# Patient Record
Sex: Male | Born: 1955 | Race: White | Hispanic: No | Marital: Married | Smoking: Former smoker
Health system: Southern US, Community
[De-identification: ages and names within clinical notes are randomized; demographics above are authoritative.]

## PROBLEM LIST (undated history)

## (undated) DIAGNOSIS — N200 Calculus of kidney: Secondary | ICD-10-CM

## (undated) HISTORY — PX: JOINT REPLACEMENT: SHX530

## (undated) HISTORY — PX: NASAL SEPTUM SURGERY: SHX37

---

## 2011-08-24 DIAGNOSIS — J309 Allergic rhinitis, unspecified: Secondary | ICD-10-CM | POA: Insufficient documentation

## 2011-08-24 DIAGNOSIS — G4700 Insomnia, unspecified: Secondary | ICD-10-CM | POA: Insufficient documentation

## 2011-08-24 DIAGNOSIS — N400 Enlarged prostate without lower urinary tract symptoms: Secondary | ICD-10-CM | POA: Insufficient documentation

## 2011-08-24 DIAGNOSIS — F329 Major depressive disorder, single episode, unspecified: Secondary | ICD-10-CM | POA: Insufficient documentation

## 2011-08-24 DIAGNOSIS — G4733 Obstructive sleep apnea (adult) (pediatric): Secondary | ICD-10-CM | POA: Insufficient documentation

## 2011-08-24 DIAGNOSIS — N529 Male erectile dysfunction, unspecified: Secondary | ICD-10-CM | POA: Insufficient documentation

## 2011-08-24 DIAGNOSIS — M1990 Unspecified osteoarthritis, unspecified site: Secondary | ICD-10-CM | POA: Insufficient documentation

## 2012-06-11 DIAGNOSIS — M5441 Lumbago with sciatica, right side: Secondary | ICD-10-CM | POA: Insufficient documentation

## 2014-08-03 DIAGNOSIS — Z9884 Bariatric surgery status: Secondary | ICD-10-CM | POA: Insufficient documentation

## 2016-08-02 DIAGNOSIS — Z96651 Presence of right artificial knee joint: Secondary | ICD-10-CM | POA: Insufficient documentation

## 2018-01-21 DIAGNOSIS — Z87891 Personal history of nicotine dependence: Secondary | ICD-10-CM | POA: Diagnosis not present

## 2018-01-21 DIAGNOSIS — Z9660 Presence of unspecified orthopedic joint implant: Secondary | ICD-10-CM | POA: Diagnosis not present

## 2018-01-21 DIAGNOSIS — N200 Calculus of kidney: Secondary | ICD-10-CM | POA: Diagnosis not present

## 2018-01-21 DIAGNOSIS — R109 Unspecified abdominal pain: Secondary | ICD-10-CM | POA: Diagnosis present

## 2018-01-22 ENCOUNTER — Other Ambulatory Visit: Payer: Self-pay

## 2018-01-22 ENCOUNTER — Emergency Department: Payer: 59

## 2018-01-22 ENCOUNTER — Encounter: Payer: Self-pay | Admitting: *Deleted

## 2018-01-22 ENCOUNTER — Emergency Department
Admission: EM | Admit: 2018-01-22 | Discharge: 2018-01-22 | Disposition: A | Discharge status: Home / Self Care | Payer: 59 | Attending: Emergency Medicine | Admitting: Emergency Medicine

## 2018-01-22 DIAGNOSIS — N200 Calculus of kidney: Secondary | ICD-10-CM

## 2018-01-22 HISTORY — DX: Calculus of kidney: N20.0

## 2018-01-22 LAB — CBC
HCT: 46.4 % (ref 40.0–52.0)
HEMOGLOBIN: 15.9 g/dL (ref 13.0–18.0)
MCH: 31.3 pg (ref 26.0–34.0)
MCHC: 34.4 g/dL (ref 32.0–36.0)
MCV: 91.2 fL (ref 80.0–100.0)
PLATELETS: 226 10*3/uL (ref 150–440)
RBC: 5.08 MIL/uL (ref 4.40–5.90)
RDW: 13.4 % (ref 11.5–14.5)
WBC: 11.5 10*3/uL — ABNORMAL HIGH (ref 3.8–10.6)

## 2018-01-22 LAB — BASIC METABOLIC PANEL
Anion gap: 9 (ref 5–15)
BUN: 14 mg/dL (ref 6–20)
CALCIUM: 8.8 mg/dL — AB (ref 8.9–10.3)
CHLORIDE: 105 mmol/L (ref 101–111)
CO2: 27 mmol/L (ref 22–32)
CREATININE: 1.26 mg/dL — AB (ref 0.61–1.24)
GFR, EST NON AFRICAN AMERICAN: 59 mL/min — AB (ref >60)
Glucose, Bld: 120 mg/dL — ABNORMAL HIGH (ref 65–99)
Potassium: 3.8 mmol/L (ref 3.5–5.1)
SODIUM: 141 mmol/L (ref 135–145)

## 2018-01-22 LAB — URINALYSIS, COMPLETE (UACMP) WITH MICROSCOPIC
BILIRUBIN URINE: NEGATIVE
Bacteria, UA: NONE SEEN
Glucose, UA: NEGATIVE mg/dL
Hgb urine dipstick: NEGATIVE
KETONES UR: NEGATIVE mg/dL
Leukocytes, UA: NEGATIVE
Nitrite: NEGATIVE
PROTEIN: NEGATIVE mg/dL
Specific Gravity, Urine: 1.017 (ref 1.005–1.030)
Squamous Epithelial / LPF: NONE SEEN
pH: 5 (ref 5.0–8.0)

## 2018-01-22 MED ORDER — ONDANSETRON 4 MG PO TBDP: 4.0000 mg | ORAL_TABLET | Freq: Three times a day (TID) | ORAL | 0 refills | Status: DC | PRN

## 2018-01-22 MED ORDER — OXYCODONE-ACETAMINOPHEN 5-325 MG PO TABS
1.0000 | ORAL_TABLET | Freq: Once | ORAL | Status: AC
  Administered 2018-01-22: 1 via ORAL

## 2018-01-22 MED ORDER — ONDANSETRON 4 MG PO TBDP
4.0000 mg | ORAL_TABLET | Freq: Once | ORAL | Status: AC
  Administered 2018-01-22: 4 mg via ORAL

## 2018-01-22 MED ORDER — ONDANSETRON 8 MG PO TBDP
ORAL_TABLET | ORAL | Status: AC
  Administered 2018-01-22: 4 mg via ORAL
  Filled 2018-01-22: qty 1

## 2018-01-22 MED ORDER — OXYCODONE-ACETAMINOPHEN 5-325 MG PO TABS: 2.0000 | ORAL_TABLET | ORAL | 0 refills | Status: DC | PRN

## 2018-01-22 MED ORDER — KETOROLAC TROMETHAMINE 30 MG/ML IJ SOLN
30.0000 mg | Freq: Once | INTRAMUSCULAR | Status: AC
  Administered 2018-01-22: 30 mg via INTRAVENOUS
  Filled 2018-01-22: qty 1

## 2018-01-22 MED ORDER — SODIUM CHLORIDE 0.9 % IV BOLUS (SEPSIS)
1000.0000 mL | Freq: Once | INTRAVENOUS | Status: AC
  Administered 2018-01-22: 1000 mL via INTRAVENOUS

## 2018-01-22 MED ORDER — OXYCODONE-ACETAMINOPHEN 5-325 MG PO TABS
ORAL_TABLET | ORAL | Status: AC
  Administered 2018-01-22: 1 via ORAL
  Filled 2018-01-22: qty 1

## 2018-01-22 NOTE — ED Notes (Signed)
Pt states onset of left sided flank pain with nausea last pm. Pt with history of renal calculi. Pt states pain felt similar to renal calculi in past. Pt states pain is currently improved.

## 2018-01-22 NOTE — ED Triage Notes (Signed)
Pt c/o L flank pain starting at 203 last night. Pt c/o nausea and scant vomiting. Pt denies blood in urine and dysuria. Pt denies fever. Pt has hx of kidney stones in the past. Pt has taken no meds for his pain today.

## 2018-01-22 NOTE — Discharge Instructions (Signed)
Please follow up with urology if you still have pain in the next 2-3 days

## 2018-01-22 NOTE — ED Notes (Signed)
Report to alicia, rn.  

## 2018-01-22 NOTE — ED Provider Notes (Signed)
[facility] Emergency Department Provider Note   ____________________________________________   First MD Initiated Contact with Patient 01/22/18 [phone]     (approximate)  I have reviewed the triage vital signs and the nursing notes.   HISTORY  Chief Complaint Flank Pain    HPI Brian Wright is a 62 y.o. male who comes into the hospital today because he is concerned that he may have a kidney stone.  The patient reports that he had some pain in his left side that came on quick around 8 PM.  He felt as though he was having a baby.  He denies any blood in his urine but he did have some nausea and vomiting.  He also denies pain with urination.  The patient has a history of kidney stones.  He is always passed them and is never had surgery for it.  His pain is currently a 2 out of 10 in intensity.  The patient is here today for evaluation.  Past Medical History:  Diagnosis Date  . Kidney stones     There are no active problems to display for this patient.   Past Surgical History:  Procedure Laterality Date  . JOINT REPLACEMENT    . NASAL SEPTUM SURGERY      Prior to Admission medications   Medication Sig Start Date End Date Taking? Authorizing Provider  ondansetron (ZOFRAN ODT) 4 MG disintegrating tablet Take 1 tablet (4 mg total) by mouth every 8 (eight) hours as needed for nausea or vomiting. 01/22/18   Rebecka Apley, MD  oxyCODONE-acetaminophen (PERCOCET/ROXICET) 5-325 MG tablet Take 2 tablets by mouth every 4 (four) hours as needed for severe pain. 01/22/18   Rebecka Apley, MD    Allergies Patient has no known allergies.  History reviewed. No pertinent family history.  Social History Social History   Tobacco Use  . Smoking status: Former Games developer  . Smokeless tobacco: Former Neurosurgeon    Types: Chew  Substance Use Topics  . Alcohol use: Yes    Frequency: Never    Comment: rarely  . Drug use: No    Review of Systems  Constitutional:  No fever/chills Eyes: No visual changes. ENT: No sore throat. Cardiovascular: Denies chest pain. Respiratory: Denies shortness of breath. Gastrointestinal: Nausea and vomiting with no abdominal pain.   No diarrhea.  No constipation. Genitourinary: Negative for dysuria. Musculoskeletal: Left flank pain Skin: Negative for rash. Neurological: Negative for headaches, focal weakness or numbness.   ____________________________________________   PHYSICAL EXAM:  VITAL SIGNS: ED Triage Vitals  Enc Vitals Group     BP 01/22/18 0007 (!) 158/81     Pulse Rate 01/22/18 0007 66     Resp 01/22/18 0007 18     Temp 01/22/18 0007 97.8 F (36.6 C)     Temp Source 01/22/18 0007 Oral     SpO2 01/22/18 0007 97 %     Weight 01/22/18 0007 268 lb (121.6 kg)     Height 01/22/18 0007 5\' 10"  (1.778 m)     Head Circumference --      Peak Flow --      Pain Score 01/22/18 0014 10     Pain Loc --      Pain Edu? --      Excl. in GC? --     Constitutional: Alert and oriented. Well appearing and in no acute distress. Eyes: Conjunctivae are normal. PERRL. EOMI. Head: Atraumatic. Nose: No congestion/rhinnorhea. Mouth/Throat: Mucous membranes are moist.  Oropharynx  non-erythematous. Cardiovascular: Normal rate, regular rhythm. Grossly normal heart sounds.  Good peripheral circulation. Respiratory: Normal respiratory effort.  No retractions. Lungs CTAB. Gastrointestinal: Soft and nontender. No distention.  Positive bowel sounds mild left-sided flank pain Musculoskeletal: No lower extremity tenderness nor edema.   Neurologic:  Normal speech and language.  Skin:  Skin is warm, dry and intact.  Psychiatric: Mood and affect are normal.   ____________________________________________   LABS (all labs ordered are listed, but only abnormal results are displayed)  Labs Reviewed  URINALYSIS, COMPLETE (UACMP) WITH MICROSCOPIC - Abnormal; Notable for the following components:      Result Value   Color, Urine  YELLOW (*)    APPearance CLEAR (*)    All other components within normal limits  CBC - Abnormal; Notable for the following components:   WBC 11.5 (*)    All other components within normal limits  BASIC METABOLIC PANEL - Abnormal; Notable for the following components:   Glucose, Bld 120 (*)    Creatinine, Ser 1.26 (*)    Calcium 8.8 (*)    GFR calc non Af Amer 59 (*)    All other components within normal limits   ____________________________________________  EKG  none ____________________________________________  RADIOLOGY  ED MD interpretation:  CT renal stone study: 4mm left kidney stone with some hydronephrosis  Official radiology report(s): Ct Renal Stone Study  Result Date: 01/22/2018 CLINICAL DATA:  Left flank pain.  Nausea.  History of kidney stones. EXAM: CT ABDOMEN AND PELVIS WITHOUT CONTRAST TECHNIQUE: Multidetector CT imaging of the abdomen and pelvis was performed following the standard protocol without IV contrast. COMPARISON:  None. FINDINGS: Lower chest: The lung bases are clear. There are coronary artery calcifications. Hepatobiliary: No focal hepatic lesion allowing for lack contrast. Clips in the gallbladder fossa postcholecystectomy. No biliary dilatation. Pancreas: No ductal dilatation or inflammation. Spleen: Upper normal in size spanning 13 cm. Adrenals/Urinary Tract: No adrenal nodule. Obstructing 4 mm stone in the distal left ureter just proximal to the ureteropelvic junction with moderate proximal hydroureteronephrosis and prominent perinephric edema. 4.7 cm simple cyst in the lower left kidney. No additional nonobstructing renal calculi. No right hydronephrosis. Mild right perinephric edema is likely chronic. Urinary bladder is nondistended and not well evaluated. Stomach/Bowel: Post bariatric surgery with staple lines in the stomach. Enteric sutures in the central abdomen with mild prominence of the small bowel adjacent to the sutures. No evidence of obstruction  or bowel inflammation. Moderate colonic stool burden. Normal appendix tentatively identified. Vascular/Lymphatic: Trace distal aorta bi-iliac atherosclerosis. No aneurysm. 16 mm left inguinal lymph node. No enlarged abdominal lymph nodes. Reproductive: Prominent sized prostate gland spanning 5.9 cm with prostatic calcifications. Other: No ascites or free air.  Fat within both inguinal canals. Musculoskeletal: Multilevel degenerative change in the spine. Posterior disc osteophyte complex fat L1-L2 and L2-L3 causes spinal canal stenosis. IMPRESSION: 1. Obstructing 4 mm stone in the distal left ureter with moderate left hydronephrosis and prominent perinephric edema. 2. Prominent left inguinal node measuring 16 mm is nonspecific, may be reactive. This should be amenable to direct palpation and clinical follow-up. 3. Aortic atherosclerosis and coronary artery calcifications. 4. Additional chronic findings as described. Electronically Signed   By: Rubye OaksMelanie  Ehinger M.D.   On: 01/22/2018 01:03    ____________________________________________   PROCEDURES  Procedure(s) performed: None  Procedures  Critical Care performed: No  ____________________________________________   INITIAL IMPRESSION / ASSESSMENT AND PLAN / ED COURSE  As part of my medical decision making, I reviewed  the following data within the electronic MEDICAL RECORD NUMBER Notes from prior ED visits and [place] Controlled Substance Database   This is a 62 year old male who comes into the pain.  The patient has a history of kidney stones and is concerned that he may be having a kidney stone.  I did send some blood work on the patient as well as a urinalysis and a CT scan.  The patient's urinalysis is unremarkable and his blood work is unremarkable.  There is some mild elevation in his creatinine.   The patient CT scan showed a 4 mm obstructing stone in the distal left ureter with some moderate left hydronephrosis.  The patient's urinalysis does  not show any sign of infection.  His pain is improved.  I did give the patient some Toradol.  He will be discharged home and encouraged to follow-up with urology.  He should return with any other concerns.  The patient did receive a liter of normal saline.      ____________________________________________   FINAL CLINICAL IMPRESSION(S) / ED DIAGNOSES  Final diagnoses:  Kidney stone     ED Discharge Orders        Ordered    oxyCODONE-acetaminophen (PERCOCET/ROXICET) 5-325 MG tablet  Every 4 hours PRN     01/22/18 0524    ondansetron (ZOFRAN ODT) 4 MG disintegrating tablet  Every 8 hours PRN     01/22/18 0524       Note:  This document was prepared using Dragon voice recognition software and may include unintentional dictation errors.    Rebecka Apley, MD 01/22/18 [phone]

## 2019-08-15 ENCOUNTER — Other Ambulatory Visit: Payer: Self-pay

## 2019-08-18 ENCOUNTER — Ambulatory Visit: Payer: Self-pay | Admitting: Urology

## 2019-12-22 ENCOUNTER — Other Ambulatory Visit: Payer: Self-pay | Admitting: Physical Medicine and Rehabilitation

## 2019-12-22 DIAGNOSIS — M5412 Radiculopathy, cervical region: Secondary | ICD-10-CM

## 2019-12-29 ENCOUNTER — Other Ambulatory Visit: Payer: Self-pay

## 2019-12-29 ENCOUNTER — Ambulatory Visit
Admission: RE | Admit: 2019-12-29 | Discharge: 2019-12-29 | Disposition: A | Discharge status: Home / Self Care | Payer: Managed Care, Other (non HMO) | Source: Ambulatory Visit | Attending: Physical Medicine and Rehabilitation | Admitting: Physical Medicine and Rehabilitation

## 2019-12-29 DIAGNOSIS — M5412 Radiculopathy, cervical region: Secondary | ICD-10-CM

## 2020-03-18 ENCOUNTER — Other Ambulatory Visit: Payer: Self-pay | Admitting: Otolaryngology

## 2020-03-18 DIAGNOSIS — J324 Chronic pansinusitis: Secondary | ICD-10-CM

## 2020-03-22 ENCOUNTER — Ambulatory Visit
Admission: RE | Admit: 2020-03-22 | Discharge: 2020-03-22 | Disposition: A | Discharge status: Home / Self Care | Payer: 59 | Source: Ambulatory Visit | Attending: Otolaryngology | Admitting: Otolaryngology

## 2020-03-22 ENCOUNTER — Other Ambulatory Visit: Payer: Self-pay

## 2020-03-22 DIAGNOSIS — J324 Chronic pansinusitis: Secondary | ICD-10-CM | POA: Insufficient documentation

## 2020-09-25 IMAGING — CT CT MAXILLOFACIAL W/O CM
3 of 5 series · 11 of 47 positions shown, 13 images · non-contrast
Comparison: None.

CLINICAL DATA: Chronic pansinusitis

EXAM:
CT MAXILLOFACIAL WITHOUT CONTRAST
TECHNIQUE: Multidetector CT imaging of the maxillofacial structures was
performed. Multiplanar CT image reconstructions were also generated.

[Series 2: brain lab 1.00 · axial · 0.49mm/px · z∈[-583,-498]mm · 5 of 117 slices shown, 7 images]
[im 16/117  brain]
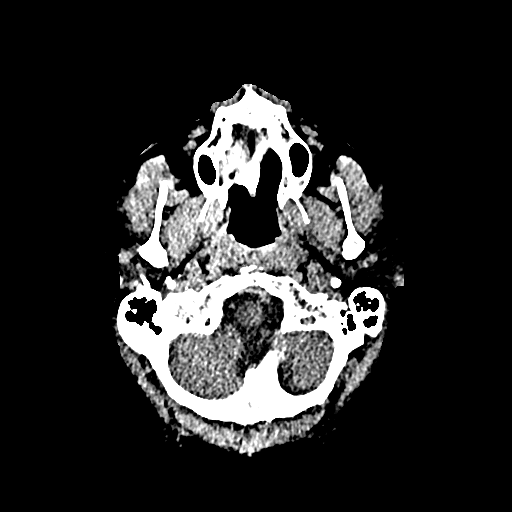
[im 16/117  bone]
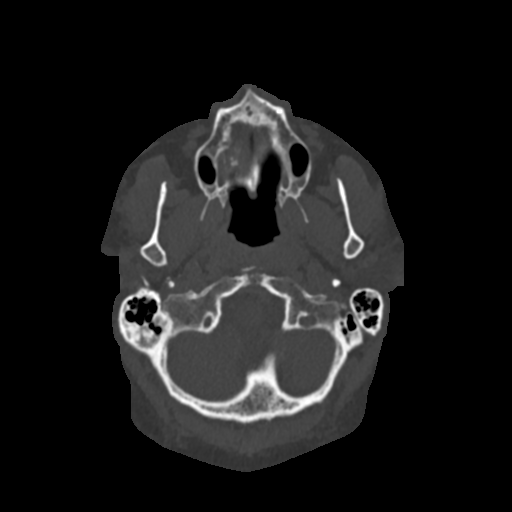
[im 39/117  bone]
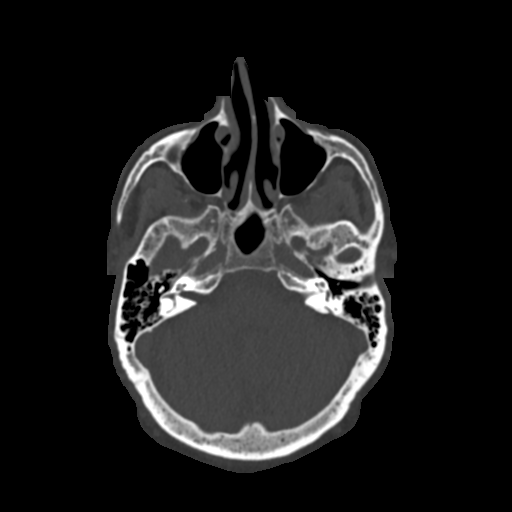
[im 62/117  bone]
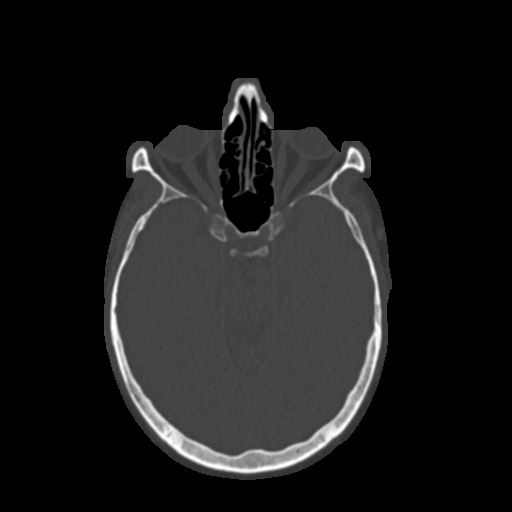
[im 78/117  bone]
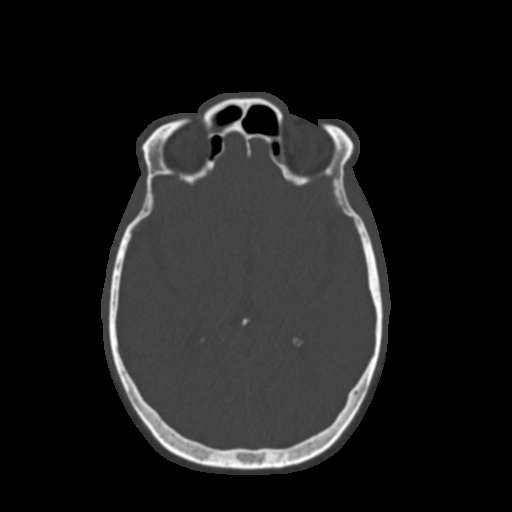
[im 101/117  brain]
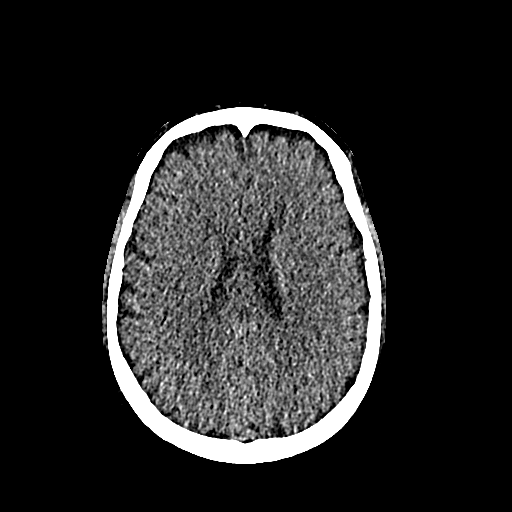
[im 101/117  bone]
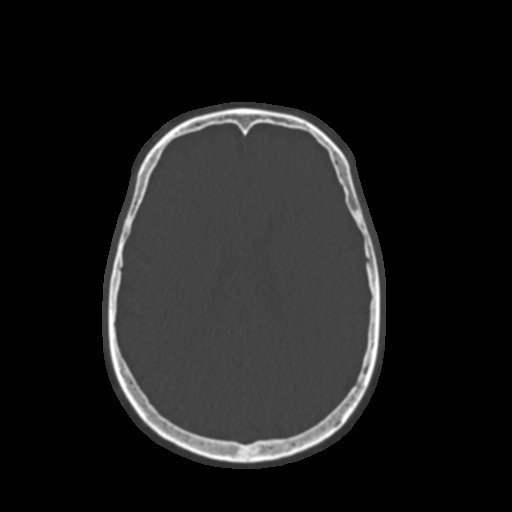

[Series 7: coronal soft brain lab 2.00 cor · coronal · 0.23mm/px · 3 of 118 slices shown]
[im 40/118  bone]
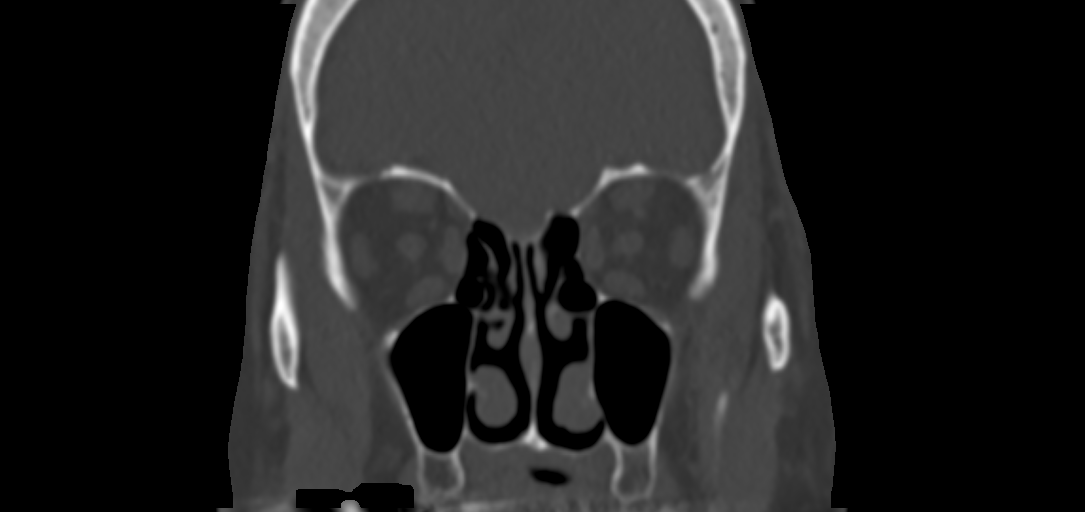
[im 53/118  bone]
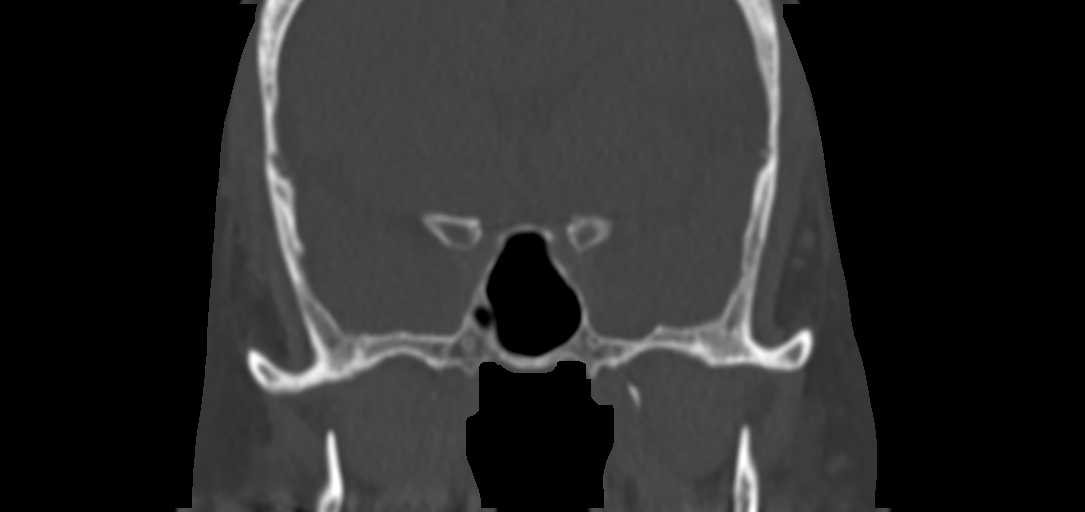
[im 66/118  bone]
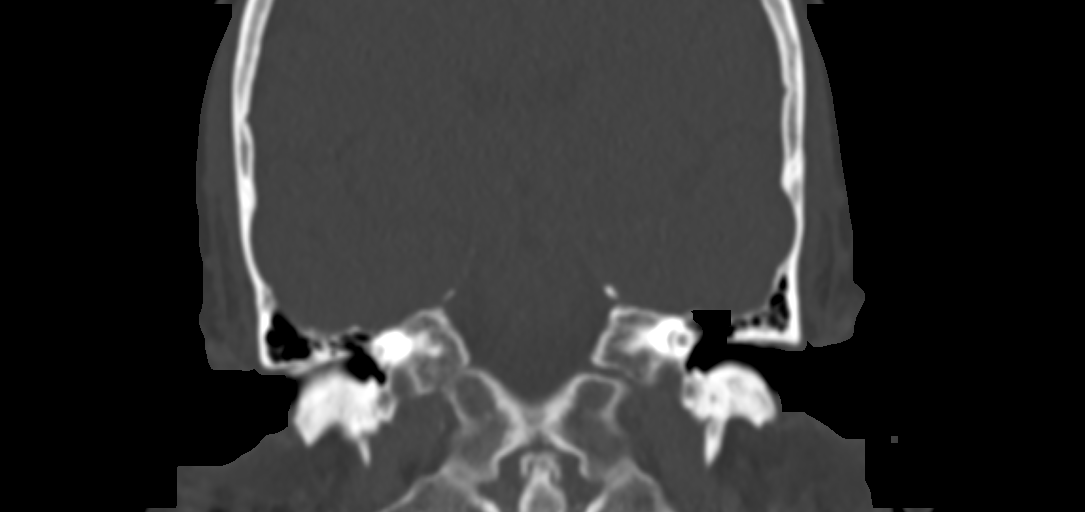

[Series 9: sagittall soft brain lab 2.00 sag · sagittal · 0.23mm/px · 3 of 92 slices shown]
[im 31/92  bone]
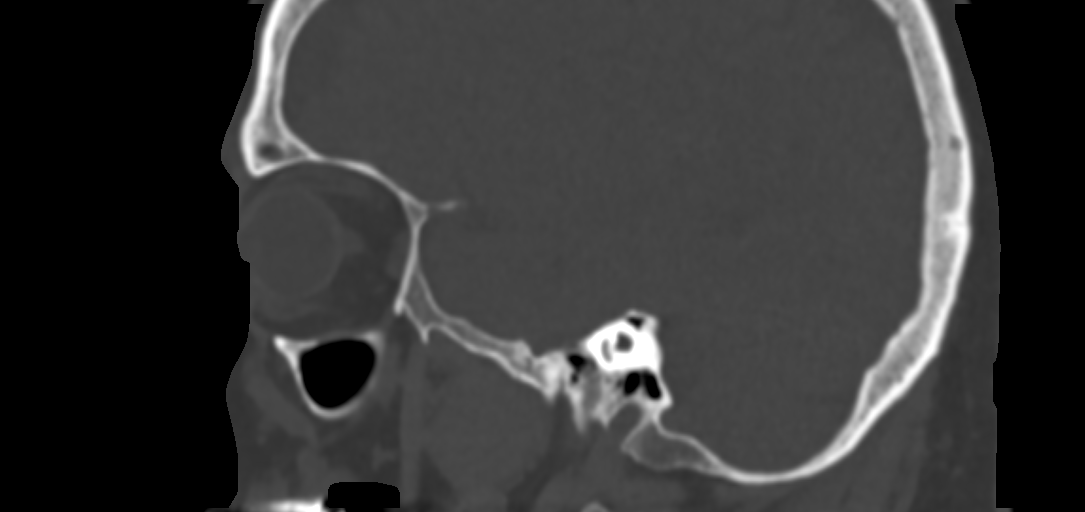
[im 46/92  bone]
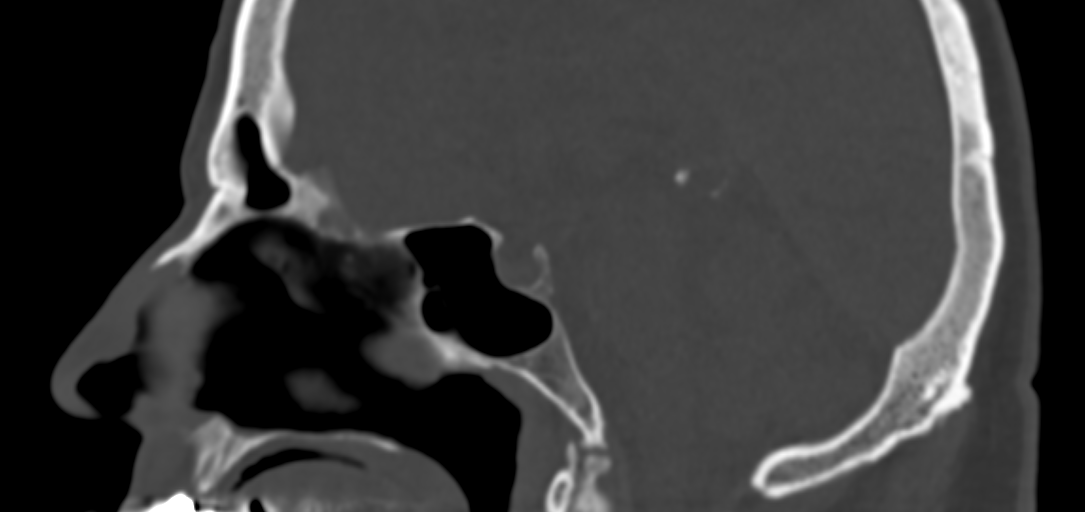
[im 61/92  bone]
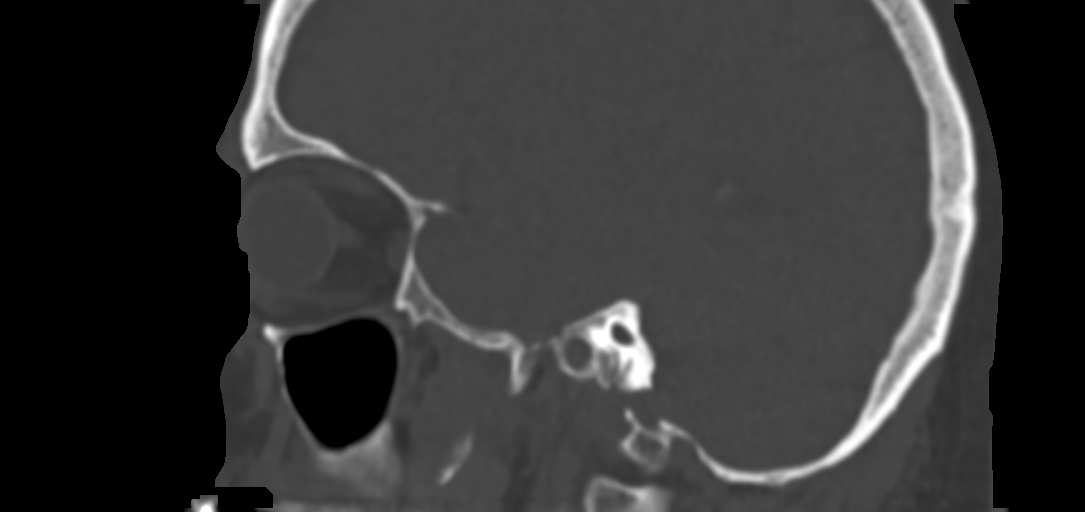

[11 of 47 positions shown; findings below may reference images not displayed]

FINDINGS: Osseous: No fracture or mandibular dislocation. No destructive
process.

Orbits: Negative. No traumatic or inflammatory finding.

Sinuses: Slight mucosal thickening in the frontal sinuses and
anterior ethmoid air cells bilaterally. Remainder the paranasal
sinuses are clear. No air-fluid levels. Mastoid air cells are clear.

Soft tissues: Negative

Limited intracranial: No significant or unexpected finding.
IMPRESSION: Slight chronic sinusitis changes in the frontal sinuses and anterior
ethmoid air cells. No evidence of acute sinusitis.
